# Patient Record
Sex: Male | Born: 1980 | Race: Asian | Hispanic: No | Marital: Married | Smoking: Never smoker
Health system: Southern US, Community
[De-identification: ages and names within clinical notes are randomized; demographics above are authoritative.]

---

## 2019-04-16 MED FILL — OMEPRAZOLE 40 MG CPDR: 40 | 30 days supply | Qty: 30 | Fill #0

## 2019-07-18 MED FILL — OMEPRAZOLE 40 MG CPDR: 40 | 30 days supply | Qty: 30 | Fill #0

## 2020-03-03 MED FILL — OMEPRAZOLE DR 40 MG CAPSULE: 40 | 30 days supply | Qty: 30 | Fill #1

## 2020-04-09 DIAGNOSIS — M25642 Stiffness of left hand, not elsewhere classified: Secondary | ICD-10-CM | POA: Diagnosis not present

## 2020-04-09 DIAGNOSIS — M79642 Pain in left hand: Secondary | ICD-10-CM | POA: Diagnosis not present

## 2020-04-09 DIAGNOSIS — S62625A Displaced fracture of medial phalanx of left ring finger, initial encounter for closed fracture: Secondary | ICD-10-CM | POA: Diagnosis not present

## 2020-04-24 DIAGNOSIS — M79642 Pain in left hand: Secondary | ICD-10-CM | POA: Diagnosis not present

## 2020-04-24 DIAGNOSIS — S62625A Displaced fracture of medial phalanx of left ring finger, initial encounter for closed fracture: Secondary | ICD-10-CM | POA: Diagnosis not present

## 2020-05-19 DIAGNOSIS — S62625A Displaced fracture of medial phalanx of left ring finger, initial encounter for closed fracture: Secondary | ICD-10-CM | POA: Diagnosis not present

## 2020-09-02 ENCOUNTER — Other Ambulatory Visit (HOSPITAL_COMMUNITY): Payer: Self-pay | Admitting: *Deleted

## 2020-09-05 ENCOUNTER — Other Ambulatory Visit: Payer: Self-pay

## 2020-09-05 ENCOUNTER — Ambulatory Visit (HOSPITAL_COMMUNITY)
Admission: RE | Admit: 2020-09-05 | Discharge: 2020-09-05 | Disposition: A | Discharge status: Home / Self Care | Payer: Self-pay | Source: Ambulatory Visit | Attending: Cardiology | Admitting: Cardiology

## 2020-11-25 ENCOUNTER — Other Ambulatory Visit (HOSPITAL_COMMUNITY): Payer: Self-pay

## 2020-11-25 MED ORDER — OMEPRAZOLE 40 MG PO CPDR
40.0000 mg | DELAYED_RELEASE_CAPSULE | Freq: Every day | ORAL | 2 refills | Status: AC
  Filled 2020-11-25: qty 90, 90d supply, fill #0

## 2021-03-30 ENCOUNTER — Other Ambulatory Visit (HOSPITAL_COMMUNITY): Payer: Self-pay

## 2021-03-30 MED ORDER — VITAMIN D (ERGOCALCIFEROL) 1.25 MG (50000 UNIT) PO CAPS
ORAL_CAPSULE | Freq: Every day | ORAL | 0 refills | Status: DC
  Filled 2021-03-30: qty 30, 30d supply, fill #0

## 2021-03-31 ENCOUNTER — Other Ambulatory Visit (HOSPITAL_COMMUNITY): Payer: Self-pay

## 2021-04-01 ENCOUNTER — Other Ambulatory Visit (HOSPITAL_COMMUNITY): Payer: Self-pay

## 2021-04-03 ENCOUNTER — Other Ambulatory Visit (HOSPITAL_COMMUNITY): Payer: Self-pay

## 2021-09-30 ENCOUNTER — Ambulatory Visit
Admission: RE | Admit: 2021-09-30 | Discharge: 2021-09-30 | Disposition: A | Discharge status: Home / Self Care | Payer: No Typology Code available for payment source | Source: Ambulatory Visit | Attending: Family Medicine | Admitting: Family Medicine

## 2021-09-30 ENCOUNTER — Other Ambulatory Visit: Payer: Self-pay | Admitting: Family Medicine

## 2021-09-30 DIAGNOSIS — M898X6 Other specified disorders of bone, lower leg: Secondary | ICD-10-CM

## 2021-11-13 ENCOUNTER — Ambulatory Visit
Admission: RE | Admit: 2021-11-13 | Discharge: 2021-11-13 | Disposition: A | Discharge status: Home / Self Care | Payer: No Typology Code available for payment source | Source: Ambulatory Visit | Attending: Family Medicine | Admitting: Family Medicine

## 2021-11-13 ENCOUNTER — Other Ambulatory Visit: Payer: Self-pay | Admitting: Family Medicine

## 2021-11-13 DIAGNOSIS — M79641 Pain in right hand: Secondary | ICD-10-CM

## 2021-11-25 ENCOUNTER — Ambulatory Visit (INDEPENDENT_AMBULATORY_CARE_PROVIDER_SITE_OTHER): Payer: No Typology Code available for payment source | Admitting: Orthopaedic Surgery

## 2021-11-25 DIAGNOSIS — S63642A Sprain of metacarpophalangeal joint of left thumb, initial encounter: Secondary | ICD-10-CM

## 2021-11-25 NOTE — Progress Notes (Signed)
Chief Complaint: Right thumb     History of Present Illness:    Bradley Weeks is a 41 y.o. male right-hand-dominant male presents with right thumb pain after an injury 12 days prior when he was playing cricket and went to catch the ball and felt the thumb bent backwards.  Since that time he has been in a removable thumb spica.  He has been having pain with pinching and gripping.  He works as a Licensed conveyancer at [employer].  He is here today for further assessment.    Surgical History:   None  PMH/PSH/Family History/Social History/Meds/Allergies:   No past medical history on file.  Social History   Socioeconomic History   Marital status: Married    Spouse name: Not on file   Number of children: Not on file   Years of education: Not on file   Highest education level: Not on file  Occupational History   Not on file  Tobacco Use   Smoking status: Not on file   Smokeless tobacco: Not on file  Substance and Sexual Activity   Alcohol use: Not on file   Drug use: Not on file   Sexual activity: Not on file  Other Topics Concern   Not on file  Social History Narrative   Not on file   Social Determinants of Health   Financial Resource Strain: Not on file  Food Insecurity: Not on file  Transportation Needs: Not on file  Physical Activity: Not on file  Stress: Not on file  Social Connections: Not on file   No family history on file. No Known Allergies Current Outpatient Medications  Medication Sig Dispense Refill   omeprazole (PRILOSEC) 40 MG capsule Take 1 capsule (40 mg total) by mouth daily. 90 capsule 2   No current facility-administered medications for this visit.   No results found.  Review of Systems:   A ROS was performed including pertinent positives and negatives as documented in the HPI.  Physical Exam :   Constitutional: NAD and appears stated age Neurological: Alert and oriented Psych: Appropriate affect and cooperative There  were no vitals taken for this visit.   Comprehensive Musculoskeletal Exam:    Tenderness palpation about the lateral ligament ulnarly of the right metacarpophalangeal joint.  No tenderness at the thumb base.  Sensation is intact in all distributions of the right hand.  Limited flexion without ability to make a full composite fist.  2+ radial pulse.  No gross opening with stress of the UCL ligament and 30 degrees of flexion of the MCP.  Imaging:   Xray (right hand 3 views): Normal   I personally reviewed and interpreted the radiographs.   Assessment:   41 y.o. male right-hand-dominant presents with a right thumb MCP UCL strain after the thumb bent backwards while playing cricket.  At today's visit I have advised that I do not feel any specific laxity with an opening stress maneuver.  To that effect I would recommend conservative management with taping and return to sport as he can tolerate.  I will plan to see him back in 2 weeks if he is not feeling better  Plan :    -Return to clinic in 2 weeks if not improved     I personally saw and evaluated the patient, and participated in the management  and treatment plan.  Vanetta Mulders, MD Attending Physician, Orthopedic Surgery  This document was dictated using Dragon voice recognition software. A reasonable attempt at proof reading has been made to minimize errors.

## 2021-12-11 ENCOUNTER — Other Ambulatory Visit (HOSPITAL_BASED_OUTPATIENT_CLINIC_OR_DEPARTMENT_OTHER): Payer: Self-pay | Admitting: Orthopaedic Surgery

## 2021-12-11 DIAGNOSIS — S63641A Sprain of metacarpophalangeal joint of right thumb, initial encounter: Secondary | ICD-10-CM

## 2021-12-18 ENCOUNTER — Encounter: Payer: No Typology Code available for payment source | Admitting: Rehabilitative and Restorative Service Providers"

## 2021-12-22 ENCOUNTER — Encounter: Payer: Self-pay | Admitting: Rehabilitative and Restorative Service Providers"

## 2021-12-22 ENCOUNTER — Ambulatory Visit (INDEPENDENT_AMBULATORY_CARE_PROVIDER_SITE_OTHER): Payer: No Typology Code available for payment source | Admitting: Rehabilitative and Restorative Service Providers"

## 2021-12-22 ENCOUNTER — Other Ambulatory Visit: Payer: Self-pay

## 2021-12-22 DIAGNOSIS — M25641 Stiffness of right hand, not elsewhere classified: Secondary | ICD-10-CM

## 2021-12-22 DIAGNOSIS — M25541 Pain in joints of right hand: Secondary | ICD-10-CM | POA: Diagnosis not present

## 2021-12-22 NOTE — Therapy (Addendum)
OUTPATIENT OCCUPATIONAL THERAPY ORTHO EVALUATION & DISCHARGE   Patient Name: Bradley Weeks MRN: [id number] DOB:May 07, 1981, 41 y.o., male Today's Date: 12/22/2021  PCP: Dr. Collene Leyden, MD REFERRING PROVIDER: Vanetta Mulders, MD   OT End of Session - 12/22/21 1304     Visit Number 1    Number of Visits 10    Date for OT Re-Evaluation 02/05/22    Authorization Type [employer]    OT Start Time 4128    OT Stop Time 1359    OT Time Calculation (min) 52 min    Activity Tolerance Patient tolerated treatment well;No increased pain;Patient limited by pain;Patient limited by fatigue    Behavior During Therapy [facility] for tasks assessed/performed             History reviewed. No pertinent past medical history. History reviewed. No pertinent surgical history. There are no problems to display for this patient.   ONSET DATE: DOI 11/13/21   REFERRING DIAG: N86.767M (ICD-10-CM) - Rupture of ulnar collateral ligament of right thumb, initial encounter  THERAPY DIAG:  Pain in joint of right hand  Stiffness of right hand, not elsewhere classified  Rationale for Evaluation and Treatment Rehabilitation  SUBJECTIVE:   SUBJECTIVE STATEMENT: He is a hospitalist doctor at the main hospital. He states about 5 weeks ago, he injured his thumb at a [employer], when it was hyperextended at MCP J. He points to the radial side of his right thumb MCP joint and states that is where it has been painful.  He bought a soft thumb spica online and wore that for the past 2-3 weeks, and also returned back to playing sports almost immediately. His pain got somewhat better after starting meds, but has lingered with home and work activities.    PERTINENT HISTORY: Per MD: "Program for R thumb MCP UCL sprain. Grip strengthening, ROM, functional exercises"   PRECAUTIONS: ~5 weeks since injury  WEIGHT BEARING RESTRICTIONS  WBAT  PAIN:  Are you having pain? Yes Rating: 1/10 at rest now in RCL area of right thumb MCP  J (was worse before meds)   FALLS: Has patient fallen in last 6 months? No  LIVING ENVIRONMENT: Lives with: lives with their family   PLOF: Independent  PATIENT GOALS To have less pain in right thumb to return to full duty and play sports.    OBJECTIVE:   HAND DOMINANCE: Right   ADLs: Overall ADLs: States decreased ability to grab, hold household objects, pain and inability to open containers, perform all FMS tasks, etc. (Especially pinching and twisting motions.)   FUNCTIONAL OUTCOME MEASURES: Eval: Patient Specific Functional Scale: 5 (open toothpaste 6.5, getting wallet out of pocket 3, using hand shower 6)  UPPER EXTREMITY ROM      Active ROM Right eval Left eval  Thumb MCP (0-60) 40 70  Thumb IP (0-80) 66 80  Thumb Radial abd/add (0-55) 60 painful 65   Thumb Palmar abd/add (0-45)     Thumb Opposition to Small Finger painful    (Blank rows = not tested)   UPPER EXTREMITY MMT:    Eval: he has painful resistance to manual pressure at thumb in flexion, ext, radial and ulnar deviation, and seems to be grossly 4-/5 MMT.   HAND FUNCTION: Eval: Grip strength Right: 48 lbs, Left: 54 lbs, right lateral pinch: 4# painful; Left lateral pinch 20#; Right 3 point pinch: 8# painful; Left 3 point pinch: 20#  COORDINATION: Eval: diminished FMS ability due to painful thumb  SENSATION: Eval:  Light touch intact today  EDEMA:   Eval: Mildly swollen in right thumb today compared to opposite side.    OBSERVATIONS:   Eval: no significant laxity to RCL or UCL today and not very painful (states meds are helping).  Thumb is mild TTP to RCL area, not UCL at MCP J today. Right thumb is tight compared to left thumb. Pinching is worst of his pain.    TODAY'S TREATMENT:  Eval: He is offered a custom, rigid orthotic, but states his soft pre-fab prevents all pain when worn. He is recommended to wear that as much as possible in next 3-4 weeks to allow for healing of ligament, to not lift  anything heavy or attempt sports before his thumb is consistently non-tender/painful.  He states he will not be able to attend therapy consistently, so OT provides HEP exercises for non-painful stretching of the thumb as as well as upgraded putty thumb strengthening to begin in 3-4 weeks as tolerated, when not painful. (As below). He also wishes to try fluidotherapy heat modality, so he uses that for 5 mins while doing AROM thumb exercise, but states no significant benefit.  OT cautions him that he likely returned to sports too soon, and did not rest his injury enough. He states understanding, but also that he "may not have time" to do his exercises or return to therapy.   Exercises - Touch Thumb to EACH Finger  - 3-4 x daily - 10 reps - thumb MCP Joint stretch  - 4-6 x daily - 3-5 reps - 15 sec hold - Whole thumb stretch  - 3-4 x daily - 3-5 reps - 15-20 hold - palmar abduction stretch  - 3-4 x daily - 3-5 reps - 15-20sec hold - Full Fist  - 2-3 x daily - 5 reps - "Duck Mouth" Strength  - 2-3 x daily - 5 reps - Finger Key Grip with Putty  - 2-3 x daily - 5 reps - Thumb Opposition with Putty  - 2-3 x daily - 5 reps - Finger Extension "Pizza!"   - 2-3 x daily - 5 reps   PATIENT EDUCATION: Education details: See tx section above for details  Person educated: Patient Education method: Verbal Instruction, Teach back, Handouts  Education comprehension: States and demonstrates understanding, Additional Education required    HOME EXERCISE PROGRAM: Access Code: [email or web address] URL: [email or web address] Date: 12/22/2021 Prepared by: Nathanael Moore  GOALS: Goals reviewed with patient? Yes   SHORT TERM GOALS: (STG required if POC>30 days)  Pt will obtain protective, custom orthotic. Target date: TBD, PRN Goal status: INITIAL  2.  Pt will demo/state understanding of initial HEP to improve pain levels and prerequisite motion. Target date: 12/22/21 Goal status: MET   LONG TERM  GOALS:  Pt will improve functional ability by decreased impairment per PSFS assessment from 5% to 9% or better, for better quality of life. Target date: 02/05/22 Goal status: INITIAL  2.  Pt will improve grip strength in right thumb key pinch from painful 4 lbs to at least 8 lbs with no pain for functional use at home and in IADLs. Target date: 02/05/22 Goal status: INITIAL  3.  Pt will improve A/ROM in right thumb MCP and IP J combined flexion from 106* to at least 130*, to have functional motion for tasks like reach and grasp.  Target date: 02/05/22 Goal status: INITIAL  4.  Pt will improve strength in right thumb flexion and ext from 4-/5 MMT to at least 5/5 MMT   to have increased functional ability to carry out selfcare and higher-level homecare tasks with no difficulty. Target date: 02/05/22 Goal status: INITIAL  5.  Pt will decrease pain with pinching from "moderate" to 2/10 or better to have better sleep and occupational participation in daily roles. Target date: 02/05/22 Goal status: INITIAL    ASSESSMENT:  CLINICAL IMPRESSION: Patient is a 41 y.o. male who was seen today for occupational therapy evaluation for right thumb UCL strain, pain in right thumb and decreased ability with dominant right hand for IADLs.    PERFORMANCE DEFICITS in functional skills including ADLs, IADLs, coordination, dexterity, edema, ROM, strength, pain, fascial restrictions, flexibility, FMC, GMC, endurance, and UE functional use, cognitive skills including safety awareness, and psychosocial skills including coping strategies, habits, and routines and behaviors.   IMPAIRMENTS are limiting patient from ADLs, IADLs, work, play, leisure, and social participation.   COMORBIDITIES may have co-morbidities  that affects occupational performance. Patient will benefit from skilled OT to address above impairments and improve overall function.  MODIFICATION OR ASSISTANCE TO COMPLETE EVALUATION: No modification of  tasks or assist necessary to complete an evaluation.  OT OCCUPATIONAL PROFILE AND HISTORY: Problem focused assessment: Including review of records relating to presenting problem.  CLINICAL DECISION MAKING: Moderate - several treatment options, min-mod task modification necessary  REHAB POTENTIAL: Good  EVALUATION COMPLEXITY: Low      PLAN: OT FREQUENCY: 1-2x/week  OT DURATION: 6 weeks (through 02/05/22)  PLANNED INTERVENTIONS: self care/ADL training, therapeutic exercise, therapeutic activity, manual therapy, passive range of motion, splinting, ultrasound, fluidotherapy, compression bandaging, moist heat, cryotherapy, contrast bath, patient/family education, coping strategies training, and Re-evaluation  RECOMMENDED OTHER SERVICES: none now   CONSULTED AND AGREED WITH PLAN OF CARE: Patient  PLAN FOR NEXT SESSION: 12/22/21: He was recommended HEP and to return to therapy for follow-ups but he states being too busy and also going on vacation.  OT will keep POC open 6 weeks, as he may choose to return if his symptoms don't decrease. He would benefit from rest, consistent treatment, and home exercises, but unfortunately he may chose to not do these things.    02/08/22: D/C now    Nathanael Moore, OTR/L, CHT 12/22/2021, 2:55 PM    OCCUPATIONAL THERAPY DISCHARGE SUMMARY  The pt stated at eval that he was happy with the treatment that he got and that he would likely be too busy to return to therapy. It's now been close to 6 weeks, and he has not returned or called us, so he will be D/C now. Goals could not be addressed. Please see note for details.   Visits from Start of Care: 1  Nathanael Moore, OTR/L, CHT 02/08/22 

## 2022-01-04 ENCOUNTER — Other Ambulatory Visit (HOSPITAL_COMMUNITY): Payer: Self-pay

## 2022-01-04 MED ORDER — OMEPRAZOLE 40 MG PO CPDR
DELAYED_RELEASE_CAPSULE | ORAL | 2 refills | Status: AC
  Filled 2022-01-04: qty 30, 30d supply, fill #0
  Filled 2022-01-05 (×2): qty 30, 30d supply, fill #1
  Filled 2022-03-22: qty 30, 30d supply, fill #2

## 2022-01-05 ENCOUNTER — Other Ambulatory Visit (HOSPITAL_COMMUNITY): Payer: Self-pay

## 2022-02-09 ENCOUNTER — Other Ambulatory Visit (HOSPITAL_COMMUNITY): Payer: Self-pay

## 2022-03-22 ENCOUNTER — Other Ambulatory Visit (HOSPITAL_COMMUNITY): Payer: Self-pay

## 2022-06-07 ENCOUNTER — Other Ambulatory Visit (HOSPITAL_COMMUNITY): Payer: Self-pay

## 2022-06-07 MED ORDER — OMEPRAZOLE 40 MG PO CPDR
40.0000 mg | DELAYED_RELEASE_CAPSULE | Freq: Every morning | ORAL | 2 refills | Status: DC
  Filled 2022-06-07: qty 30, 70d supply, fill #0
  Filled 2022-08-02: qty 30, 70d supply, fill #1
  Filled 2022-09-24: qty 30, 70d supply, fill #2

## 2022-06-08 ENCOUNTER — Other Ambulatory Visit (HOSPITAL_COMMUNITY): Payer: Self-pay

## 2022-08-02 ENCOUNTER — Other Ambulatory Visit (HOSPITAL_COMMUNITY): Payer: Self-pay

## 2022-08-05 ENCOUNTER — Other Ambulatory Visit: Payer: Self-pay | Admitting: Family Medicine

## 2022-08-05 ENCOUNTER — Other Ambulatory Visit (HOSPITAL_COMMUNITY): Payer: Self-pay

## 2022-08-05 ENCOUNTER — Ambulatory Visit
Admission: RE | Admit: 2022-08-05 | Discharge: 2022-08-05 | Disposition: A | Discharge status: Home / Self Care | Payer: No Typology Code available for payment source | Source: Ambulatory Visit | Attending: Family Medicine | Admitting: Family Medicine

## 2022-08-05 ENCOUNTER — Ambulatory Visit: Payer: No Typology Code available for payment source | Admitting: Podiatry

## 2022-08-05 ENCOUNTER — Ambulatory Visit (INDEPENDENT_AMBULATORY_CARE_PROVIDER_SITE_OTHER): Payer: No Typology Code available for payment source

## 2022-08-05 ENCOUNTER — Encounter: Payer: Self-pay | Admitting: Podiatry

## 2022-08-05 DIAGNOSIS — M79674 Pain in right toe(s): Secondary | ICD-10-CM

## 2022-08-05 DIAGNOSIS — M25571 Pain in right ankle and joints of right foot: Secondary | ICD-10-CM

## 2022-08-05 DIAGNOSIS — M7751 Other enthesopathy of right foot: Secondary | ICD-10-CM

## 2022-08-05 MED ORDER — PREDNISONE 20 MG PO TABS
40.0000 mg | ORAL_TABLET | Freq: Every day | ORAL | 0 refills | Status: AC
  Filled 2022-08-05: qty 10, 5d supply, fill #0

## 2022-08-05 MED ORDER — TRIAMCINOLONE ACETONIDE 10 MG/ML IJ SUSP
10.0000 mg | Freq: Once | INTRAMUSCULAR | Status: AC
  Administered 2022-08-05: 10 mg

## 2022-08-06 ENCOUNTER — Other Ambulatory Visit: Payer: Self-pay | Admitting: Podiatry

## 2022-08-06 DIAGNOSIS — M7751 Other enthesopathy of right foot: Secondary | ICD-10-CM

## 2022-08-06 DIAGNOSIS — M79674 Pain in right toe(s): Secondary | ICD-10-CM

## 2022-08-06 NOTE — Progress Notes (Signed)
Subjective:   Patient ID: Bradley Weeks, male   DOB: 42 y.o.   MRN: [id number]   HPI Patient presents with throbbing under his right big toe of 2 days duration.  Does not think of any kind of injury which may have occurred but he is active and also uses a treadmill at an incline.  Patient does not smoke   Review of Systems  All other systems reviewed and are negative.       Objective:  Physical Exam Vitals and nursing note reviewed.  Constitutional:      Appearance: He is well-developed.  Pulmonary:     Effort: Pulmonary effort is normal.  Musculoskeletal:        General: Normal range of motion.  Skin:    General: Skin is warm.  Neurological:     Mental Status: He is alert.     Neurovascular status found to be intact muscle strength adequate range of motion adequate with inflammation of the plantar hallux proximal to the inner phalangeal joint.  There is fluid within the area is localized and he does have some enlargement of tissue but he does have that bilateral and states that is a more normal finding.  It is quite sore when pressed and he does have a prescription for a steroid Dosepak that he has not started     Assessment:  Inflammatory capsulitis plantar aspect right hallux cannot rule out other pathology but this appears to be most likely and patient is leaving for a trip tomorrow and is looking for any kind of short-term relief he can get     Plan:  H&P x-ray reviewed and today I went ahead I did a sterile prep and I injected the plantar capsule 3 mg dexamethasone Kenalog 5 mg Xylocaine applied sterile dressing and gave instructions on possibly taking the Dosepak if symptoms persist.  Also recommended rigid bottom shoes and if symptoms persist may require MRI in future  X-rays indicate no signs of pathology no osteolysis or bony injury noted

## 2022-09-24 ENCOUNTER — Other Ambulatory Visit (HOSPITAL_COMMUNITY): Payer: Self-pay

## 2022-11-03 ENCOUNTER — Other Ambulatory Visit (HOSPITAL_COMMUNITY): Payer: Self-pay

## 2022-11-03 MED ORDER — OMEPRAZOLE 40 MG PO CPDR
40.0000 mg | DELAYED_RELEASE_CAPSULE | Freq: Every morning | ORAL | 2 refills | Status: DC
  Filled 2022-11-03: qty 30, 30d supply, fill #0
  Filled 2022-12-08: qty 30, 30d supply, fill #1
  Filled 2023-02-03: qty 30, 30d supply, fill #2

## 2022-11-04 ENCOUNTER — Other Ambulatory Visit (HOSPITAL_COMMUNITY): Payer: Self-pay

## 2022-12-08 ENCOUNTER — Other Ambulatory Visit (HOSPITAL_COMMUNITY): Payer: Self-pay

## 2023-02-03 ENCOUNTER — Other Ambulatory Visit (HOSPITAL_COMMUNITY): Payer: Self-pay

## 2023-02-08 ENCOUNTER — Other Ambulatory Visit (HOSPITAL_COMMUNITY): Payer: Self-pay

## 2023-03-08 ENCOUNTER — Other Ambulatory Visit (HOSPITAL_COMMUNITY): Payer: Self-pay

## 2023-03-08 MED ORDER — OMEPRAZOLE 40 MG PO CPDR
40.0000 mg | DELAYED_RELEASE_CAPSULE | Freq: Every morning | ORAL | 2 refills | Status: DC
  Filled 2023-03-08: qty 30, 30d supply, fill #0
  Filled 2023-04-22: qty 30, 30d supply, fill #1
  Filled 2023-05-30: qty 30, 30d supply, fill #2

## 2023-03-09 ENCOUNTER — Other Ambulatory Visit (HOSPITAL_COMMUNITY): Payer: Self-pay

## 2023-04-22 ENCOUNTER — Other Ambulatory Visit (HOSPITAL_COMMUNITY): Payer: Self-pay

## 2023-05-30 ENCOUNTER — Other Ambulatory Visit (HOSPITAL_COMMUNITY): Payer: Self-pay

## 2023-06-14 ENCOUNTER — Other Ambulatory Visit (HOSPITAL_COMMUNITY): Payer: Self-pay

## 2023-06-14 MED ORDER — OSELTAMIVIR PHOSPHATE 75 MG PO CAPS
75.0000 mg | ORAL_CAPSULE | Freq: Every day | ORAL | 0 refills | Status: AC
  Filled 2023-06-14: qty 10, 10d supply, fill #0

## 2023-06-27 ENCOUNTER — Other Ambulatory Visit (HOSPITAL_COMMUNITY): Payer: Self-pay

## 2023-06-27 MED ORDER — OMEPRAZOLE 40 MG PO CPDR
40.0000 mg | DELAYED_RELEASE_CAPSULE | Freq: Every morning | ORAL | 2 refills | Status: DC
  Filled 2023-06-27: qty 30, 30d supply, fill #0
  Filled 2023-07-29: qty 30, 30d supply, fill #1
  Filled 2024-01-02: qty 30, 30d supply, fill #2

## 2023-10-05 ENCOUNTER — Other Ambulatory Visit (HOSPITAL_COMMUNITY): Payer: Self-pay

## 2023-10-05 MED ORDER — TETANUS-DIPHTH-ACELL PERTUSSIS 5-2.5-18.5 LF-MCG/0.5 IM SUSY
0.5000 mL | PREFILLED_SYRINGE | Freq: Once | INTRAMUSCULAR | 0 refills | Status: AC
  Filled 2023-10-05: qty 0.5, 1d supply, fill #0

## 2024-02-02 ENCOUNTER — Other Ambulatory Visit (HOSPITAL_COMMUNITY): Payer: Self-pay

## 2024-02-02 MED ORDER — OMEPRAZOLE 40 MG PO CPDR
40.0000 mg | DELAYED_RELEASE_CAPSULE | Freq: Every morning | ORAL | 0 refills | Status: DC
  Filled 2024-02-02 – 2024-02-15 (×2): qty 90, 90d supply, fill #0

## 2024-02-14 ENCOUNTER — Other Ambulatory Visit (HOSPITAL_COMMUNITY): Payer: Self-pay

## 2024-02-15 ENCOUNTER — Other Ambulatory Visit (HOSPITAL_COMMUNITY): Payer: Self-pay

## 2024-04-20 IMAGING — DX DG TIBIA/FIBULA 2V*L*
2 series · 2 of 2 positions shown · non-contrast
Comparison: None Available.

CLINICAL DATA: Anterior lower leg pain for several weeks. No known
injury.

EXAM:
LEFT TIBIA AND FIBULA - 2 VIEW; RIGHT TIBIA AND FIBULA - 2 VIEW

[dg tibia/fibula left (1 of 2)]
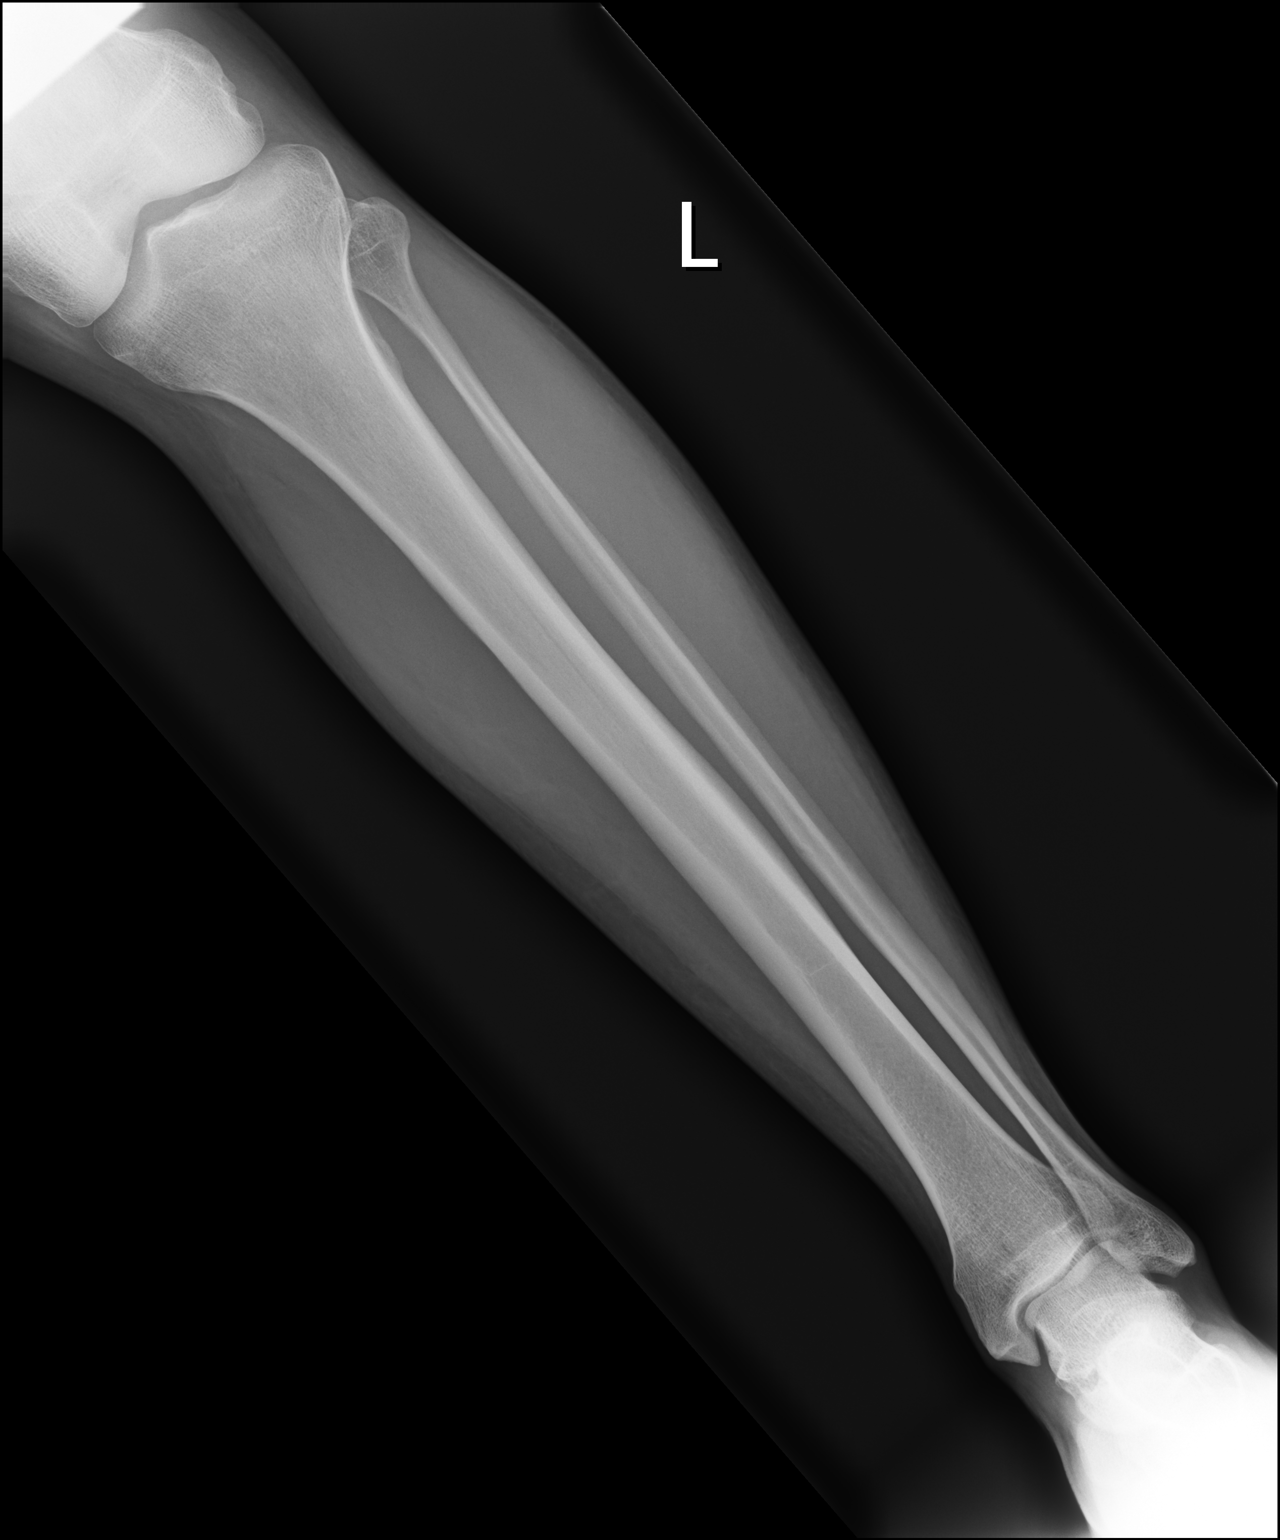

[dg tibia/fibula left (2 of 2)]
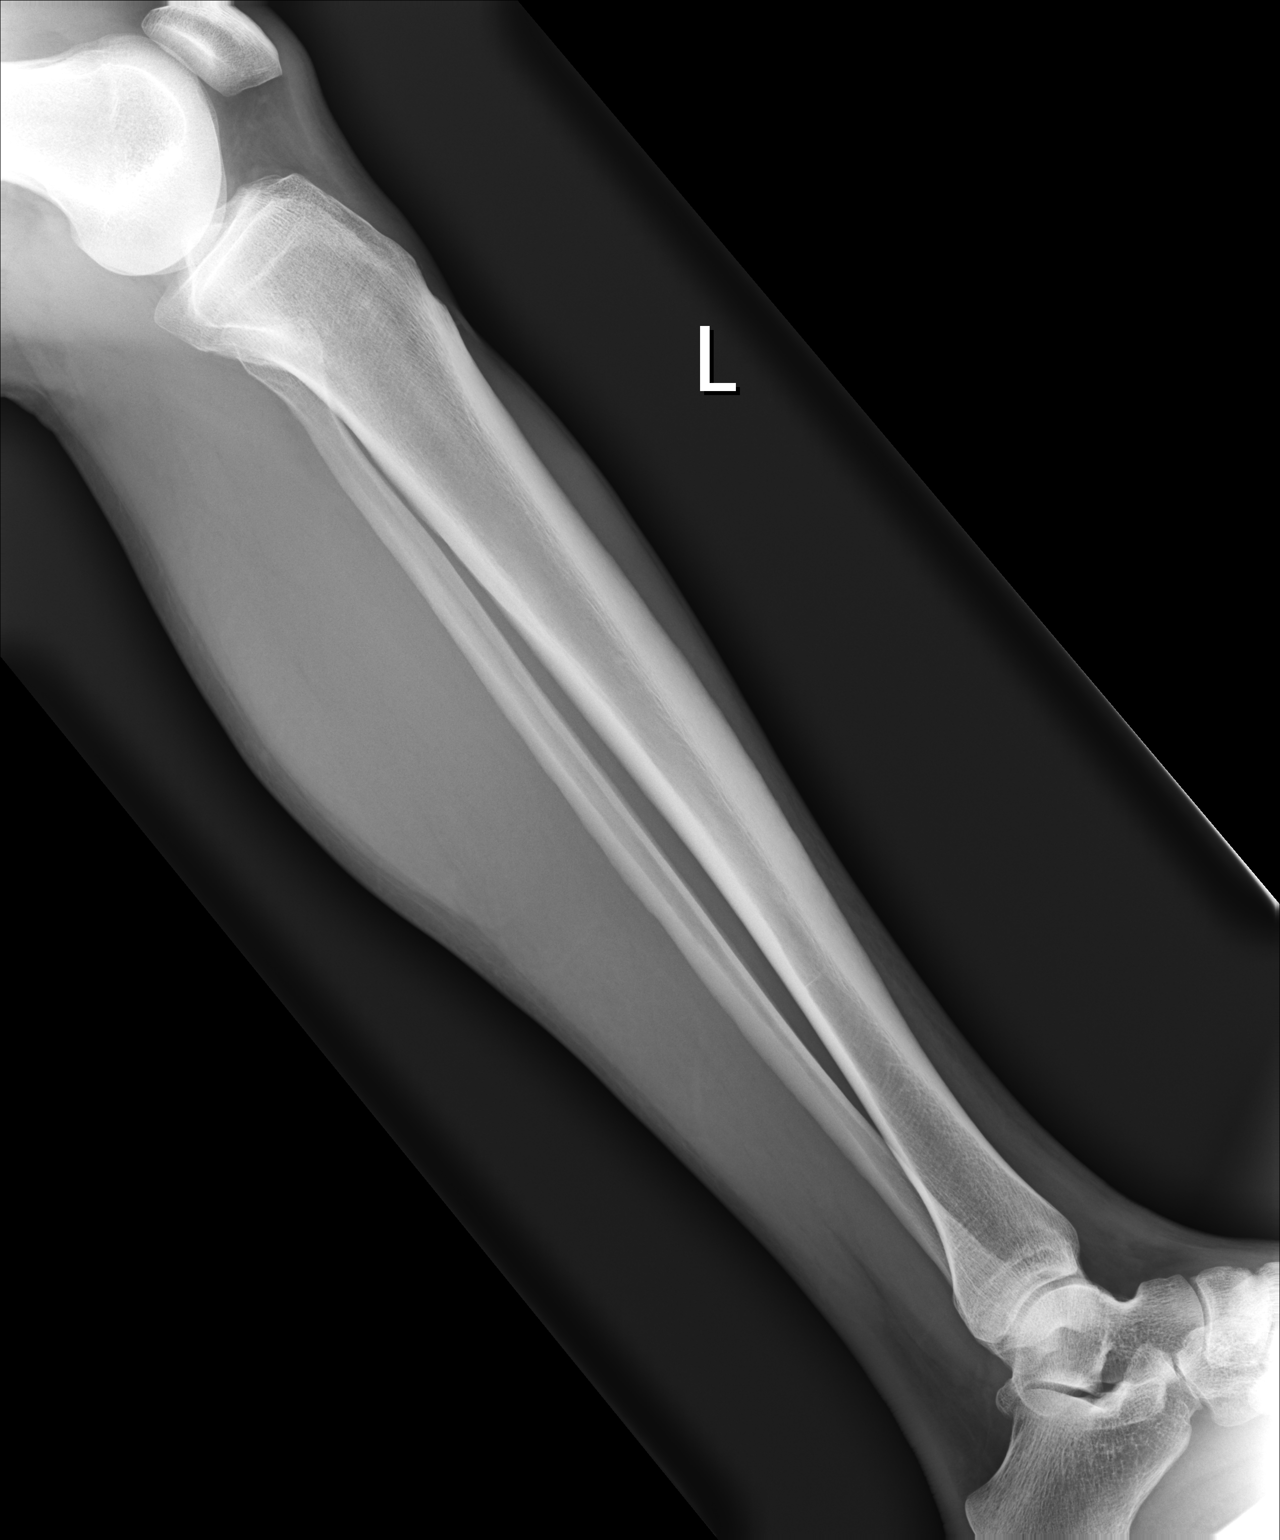

[2 of 2 positions shown; findings below may reference images not displayed]

FINDINGS: The mineralization and alignment are normal. There is no evidence of
acute fracture or dislocation. The joint spaces are preserved. The
soft tissues appear normal.
IMPRESSION: No radiographic abnormality in either lower leg.

## 2024-04-20 IMAGING — DX DG TIBIA/FIBULA 2V*R*
2 series · 2 of 2 positions shown · non-contrast
Comparison: None Available.

CLINICAL DATA: Anterior lower leg pain for several weeks. No known
injury.

EXAM:
LEFT TIBIA AND FIBULA - 2 VIEW; RIGHT TIBIA AND FIBULA - 2 VIEW

[dg tibia/fibula right (1 of 2)]
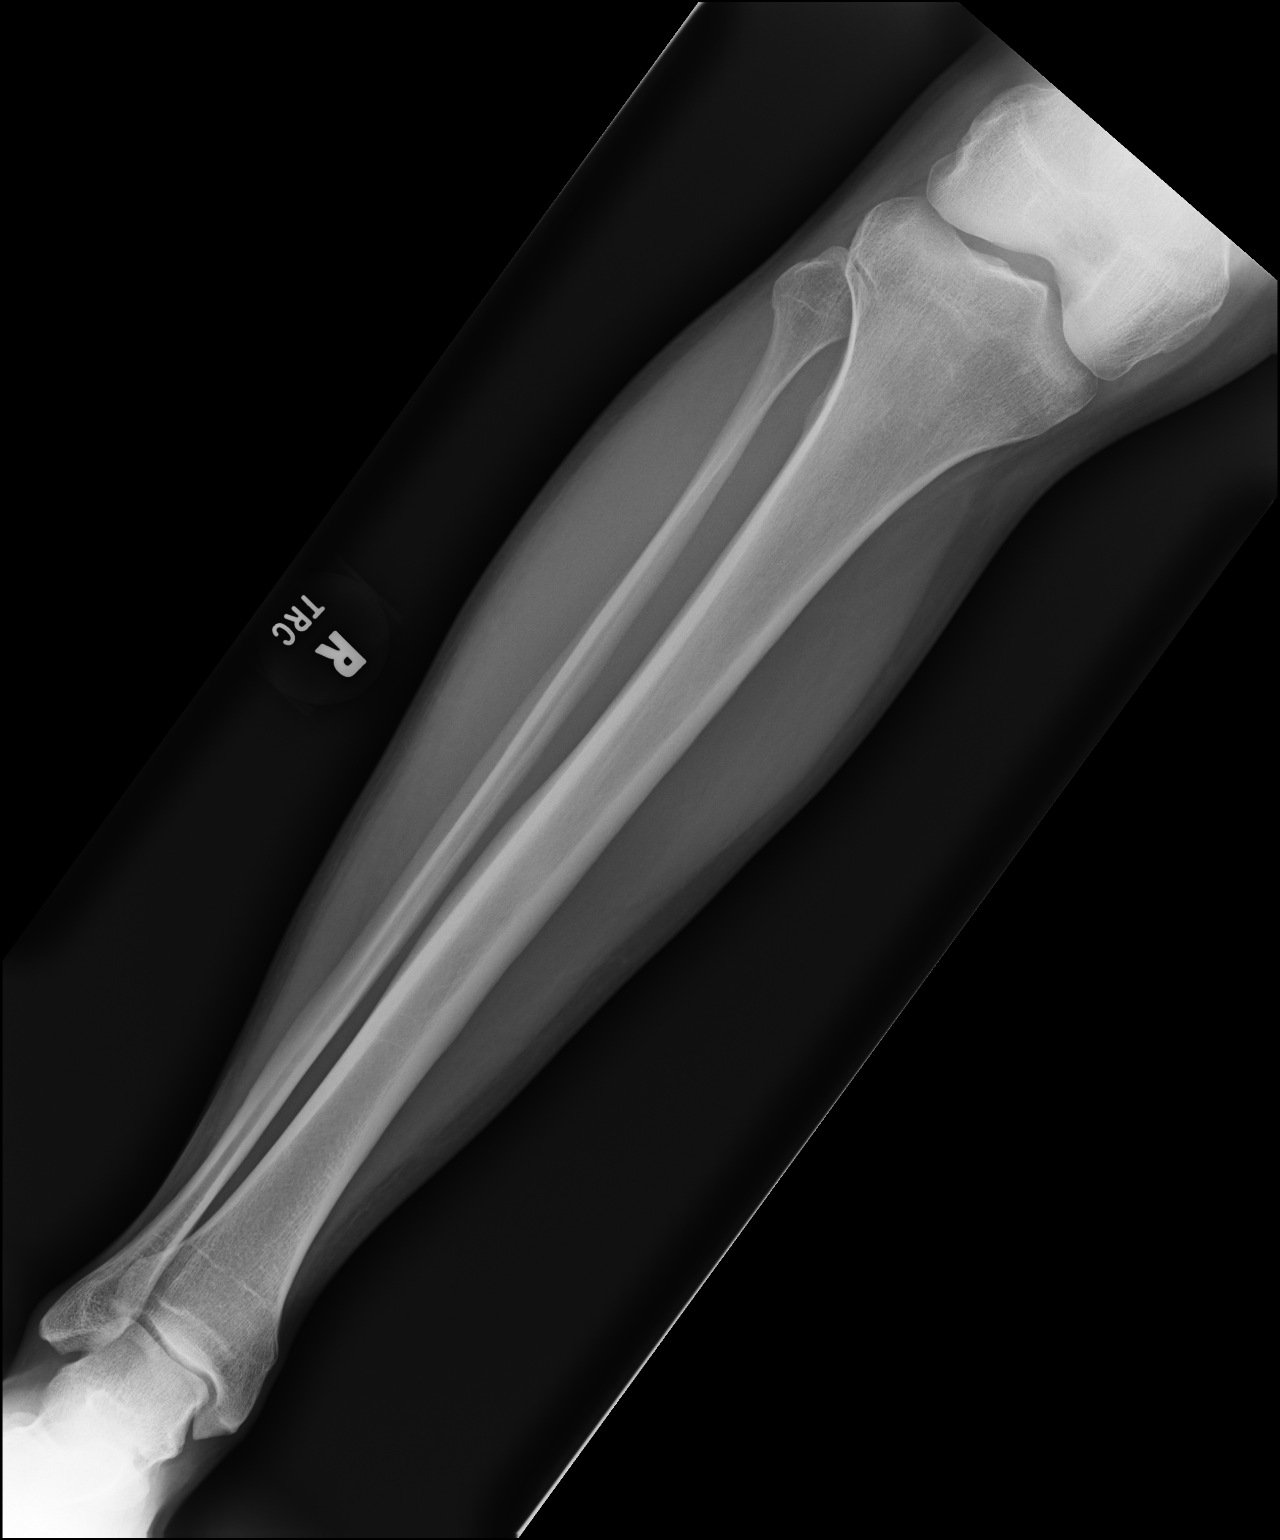

[dg tibia/fibula right (2 of 2)]
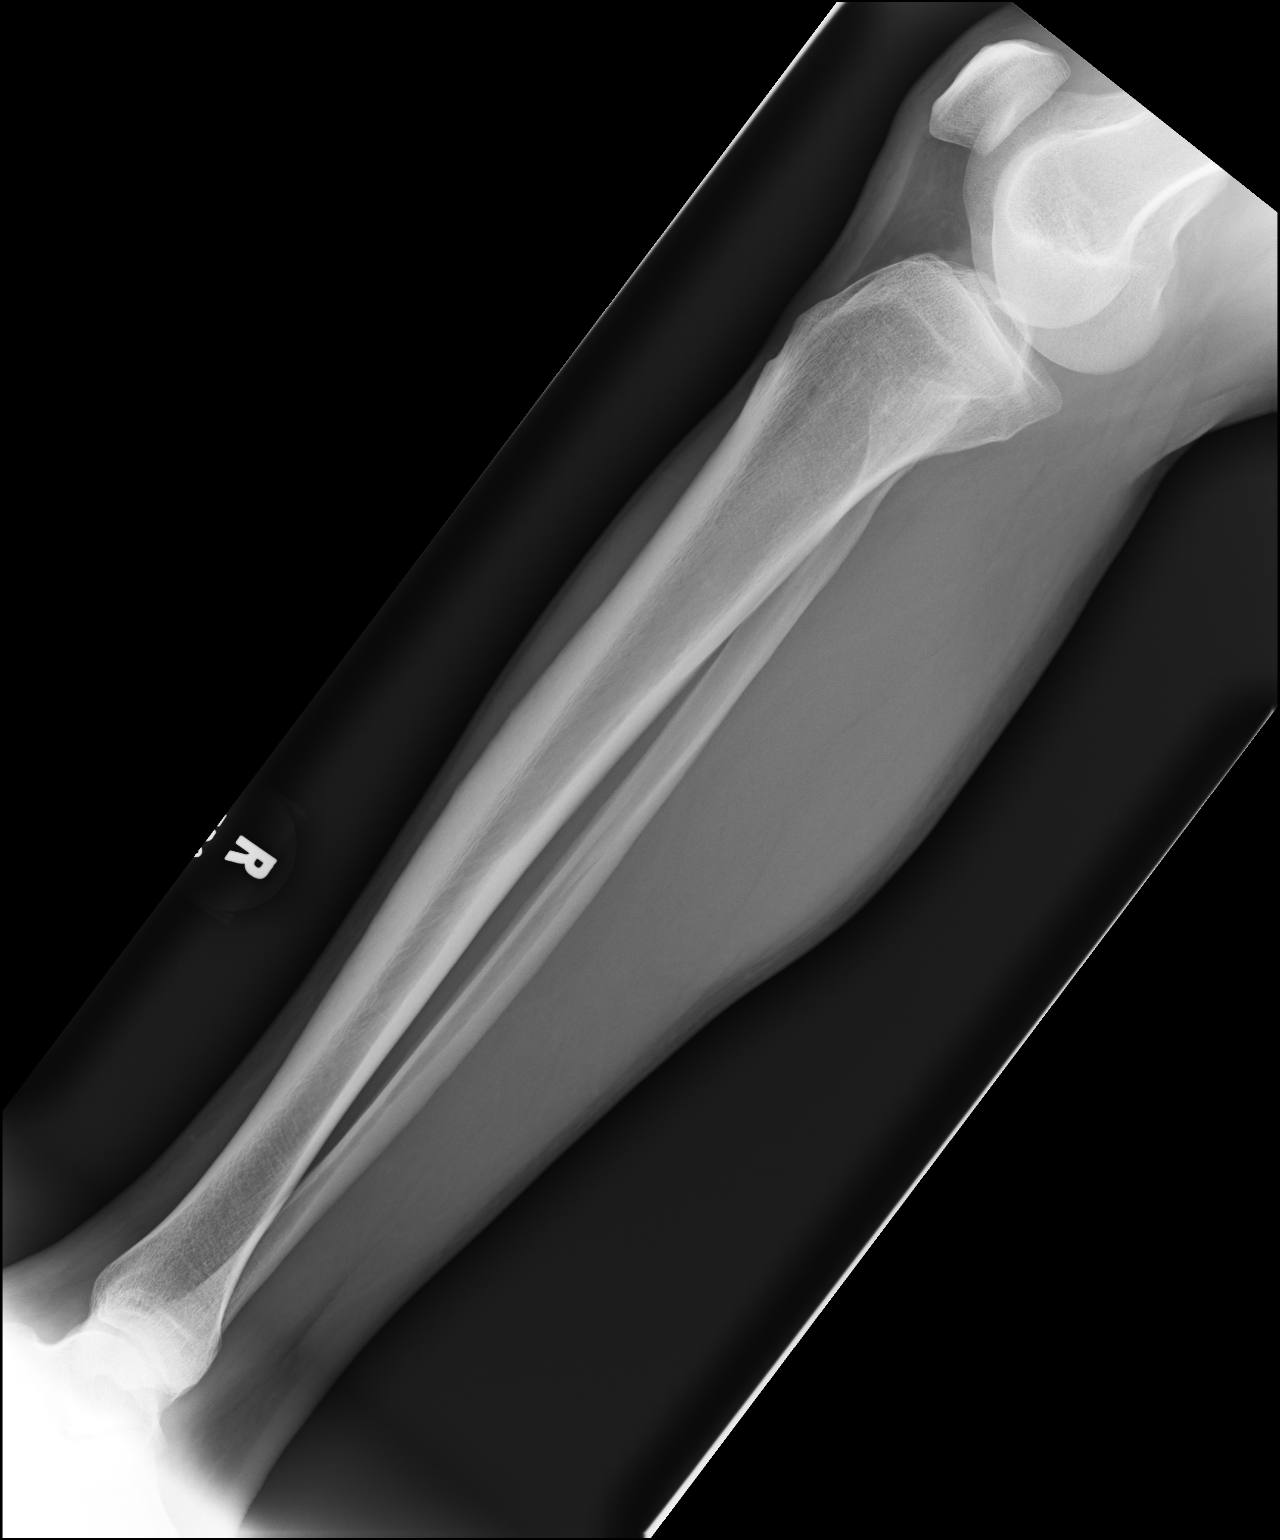

[2 of 2 positions shown; findings below may reference images not displayed]

FINDINGS: The mineralization and alignment are normal. There is no evidence of
acute fracture or dislocation. The joint spaces are preserved. The
soft tissues appear normal.
IMPRESSION: No radiographic abnormality in either lower leg.

## 2024-04-23 ENCOUNTER — Other Ambulatory Visit (HOSPITAL_COMMUNITY): Payer: Self-pay

## 2024-04-24 ENCOUNTER — Other Ambulatory Visit (HOSPITAL_COMMUNITY): Payer: Self-pay

## 2024-04-24 MED ORDER — OMEPRAZOLE 40 MG PO CPDR
40.0000 mg | DELAYED_RELEASE_CAPSULE | Freq: Every day | ORAL | 1 refills | Status: AC
  Filled 2024-04-24: qty 90, 90d supply, fill #0

## 2024-04-25 ENCOUNTER — Other Ambulatory Visit (HOSPITAL_COMMUNITY): Payer: Self-pay

## 2024-06-04 ENCOUNTER — Other Ambulatory Visit (HOSPITAL_COMMUNITY): Payer: Self-pay

## 2024-06-04 MED ORDER — FAMOTIDINE 20 MG PO TABS
20.0000 mg | ORAL_TABLET | Freq: Every evening | ORAL | 1 refills | Status: AC | PRN
  Filled 2024-06-04: qty 30, 30d supply, fill #0

## 2024-06-04 MED ORDER — RABEPRAZOLE SODIUM 20 MG PO TBEC
20.0000 mg | DELAYED_RELEASE_TABLET | Freq: Two times a day (BID) | ORAL | 1 refills | Status: AC
  Filled 2024-06-04: qty 60, 30d supply, fill #0
